# Patient Record
Sex: Female | Born: 2000 | Race: Asian
Health system: Southern US, Academic
[De-identification: ages and names within clinical notes are randomized; demographics above are authoritative.]

## PROBLEM LIST (undated history)

## (undated) ENCOUNTER — Encounter

## (undated) HISTORY — PX: TONSILLECTOMY: SUR1361

---
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
